# Patient Record
Sex: Male | Born: 1965 | Hispanic: Yes | Marital: Single | Smoking: Never smoker
Health system: Southern US, Community
[De-identification: ages and names within clinical notes are randomized; demographics above are authoritative.]

## PROBLEM LIST (undated history)

## (undated) HISTORY — PX: APPENDECTOMY: SHX54

## (undated) HISTORY — PX: OTHER SURGICAL HISTORY: SHX169

---

## 2018-11-08 ENCOUNTER — Emergency Department (HOSPITAL_COMMUNITY)
Admission: EM | Admit: 2018-11-08 | Discharge: 2018-11-08 | Disposition: A | Discharge status: Home / Self Care | Payer: BLUE CROSS/BLUE SHIELD | Attending: Emergency Medicine | Admitting: Emergency Medicine

## 2018-11-08 ENCOUNTER — Other Ambulatory Visit: Payer: Self-pay

## 2018-11-08 ENCOUNTER — Encounter (HOSPITAL_COMMUNITY): Payer: Self-pay | Admitting: Emergency Medicine

## 2018-11-08 DIAGNOSIS — R110 Nausea: Secondary | ICD-10-CM | POA: Diagnosis not present

## 2018-11-08 DIAGNOSIS — R5383 Other fatigue: Secondary | ICD-10-CM | POA: Diagnosis not present

## 2018-11-08 DIAGNOSIS — R197 Diarrhea, unspecified: Secondary | ICD-10-CM | POA: Insufficient documentation

## 2018-11-08 DIAGNOSIS — R509 Fever, unspecified: Secondary | ICD-10-CM | POA: Diagnosis not present

## 2018-11-08 LAB — CBC WITH DIFFERENTIAL/PLATELET
Abs Immature Granulocytes: 0.07 10*3/uL (ref 0.00–0.07)
Basophils Absolute: 0 10*3/uL (ref 0.0–0.1)
Basophils Relative: 0 %
Eosinophils Absolute: 0 10*3/uL (ref 0.0–0.5)
Eosinophils Relative: 0 %
HCT: 46 % (ref 39.0–52.0)
Hemoglobin: 14.9 g/dL (ref 13.0–17.0)
Immature Granulocytes: 1 %
Lymphocytes Relative: 6 %
Lymphs Abs: 0.9 10*3/uL (ref 0.7–4.0)
MCH: 27.9 pg (ref 26.0–34.0)
MCHC: 32.4 g/dL (ref 30.0–36.0)
MCV: 86 fL (ref 80.0–100.0)
Monocytes Absolute: 0.8 10*3/uL (ref 0.1–1.0)
Monocytes Relative: 6 %
Neutro Abs: 12.7 10*3/uL — ABNORMAL HIGH (ref 1.7–7.7)
Neutrophils Relative %: 87 %
Platelets: 196 10*3/uL (ref 150–400)
RBC: 5.35 MIL/uL (ref 4.22–5.81)
RDW: 12.9 % (ref 11.5–15.5)
WBC: 14.5 10*3/uL — ABNORMAL HIGH (ref 4.0–10.5)
nRBC: 0 % (ref 0.0–0.2)

## 2018-11-08 LAB — COMPREHENSIVE METABOLIC PANEL
ALT: 36 U/L (ref 0–44)
AST: 36 U/L (ref 15–41)
Albumin: 3.6 g/dL (ref 3.5–5.0)
Alkaline Phosphatase: 58 U/L (ref 38–126)
Anion gap: 13 (ref 5–15)
BUN: 18 mg/dL (ref 6–20)
CO2: 21 mmol/L — ABNORMAL LOW (ref 22–32)
Calcium: 8.6 mg/dL — ABNORMAL LOW (ref 8.9–10.3)
Chloride: 101 mmol/L (ref 98–111)
Creatinine, Ser: 1.31 mg/dL — ABNORMAL HIGH (ref 0.61–1.24)
GFR calc Af Amer: >60 mL/min (ref >60)
GFR calc non Af Amer: >60 mL/min (ref >60)
Glucose, Bld: 116 mg/dL — ABNORMAL HIGH (ref 70–99)
Potassium: 3.4 mmol/L — ABNORMAL LOW (ref 3.5–5.1)
Sodium: 135 mmol/L (ref 135–145)
Total Bilirubin: 0.7 mg/dL (ref 0.3–1.2)
Total Protein: 6.6 g/dL (ref 6.5–8.1)

## 2018-11-08 LAB — LIPASE, BLOOD: Lipase: 21 U/L (ref 11–51)

## 2018-11-08 LAB — LACTIC ACID, PLASMA: Lactic Acid, Venous: 1.5 mmol/L (ref 0.5–1.9)

## 2018-11-08 MED ORDER — LOPERAMIDE HCL 2 MG PO CAPS
4.0000 mg | ORAL_CAPSULE | Freq: Once | ORAL | Status: AC
  Administered 2018-11-08: 4 mg via ORAL
  Filled 2018-11-08: qty 2

## 2018-11-08 MED ORDER — ONDANSETRON HCL 4 MG PO TABS: 4.0000 mg | ORAL_TABLET | Freq: Four times a day (QID) | ORAL | 0 refills | Status: DC

## 2018-11-08 MED ORDER — LOPERAMIDE HCL 2 MG PO CAPS: 2.0000 mg | ORAL_CAPSULE | Freq: Four times a day (QID) | ORAL | 0 refills | Status: DC | PRN

## 2018-11-08 MED ORDER — SODIUM CHLORIDE 0.9 % IV BOLUS
1000.0000 mL | Freq: Once | INTRAVENOUS | Status: AC
  Administered 2018-11-08: 1000 mL via INTRAVENOUS

## 2018-11-08 MED ORDER — ONDANSETRON HCL 4 MG/2ML IJ SOLN
4.0000 mg | Freq: Once | INTRAMUSCULAR | Status: AC
  Administered 2018-11-08: 4 mg via INTRAVENOUS
  Filled 2018-11-08: qty 2

## 2018-11-08 MED ORDER — ACETAMINOPHEN 325 MG PO TABS
650.0000 mg | ORAL_TABLET | Freq: Once | ORAL | Status: AC
  Administered 2018-11-08: 650 mg via ORAL
  Filled 2018-11-08: qty 2

## 2018-11-08 MED ORDER — SODIUM CHLORIDE 0.9 % IV BOLUS
500.0000 mL | Freq: Once | INTRAVENOUS | Status: AC
  Administered 2018-11-08: 500 mL via INTRAVENOUS

## 2018-11-08 MED ORDER — IBUPROFEN 800 MG PO TABS
800.0000 mg | ORAL_TABLET | Freq: Once | ORAL | Status: AC
  Administered 2018-11-08: 800 mg via ORAL
  Filled 2018-11-08: qty 1

## 2018-11-08 NOTE — ED Provider Notes (Signed)
[facility] EMERGENCY DEPARTMENT Provider Note   CSN: [id number] Arrival date & time: 11/08/18  1696    History   Chief Complaint Chief Complaint  Patient presents with  . Fever  . Nausea  . Emesis  . Diarrhea    HPI Allen Gross is a 53 y.o. male.     HPI   53 year old male presents today with complaints of diarrhea and fever.  Patient notes yesterday around 4 PM he developed diarrhea and fatigue.  He notes fever at home, he notes taking Tylenol yesterday.  He reports no frank blood with his diarrhea but notes that it is extremely watery and slightly red.  He denies any associate abdominal pain, he notes some nausea denies any vomiting.  He denies any abnormal exposures but notes that he did recently come back from [place] last week.  Reports while there he drank only bottled water.  He notes 7 bowel movements over the last day.  He denies any insect bites, denies any close sick contacts. No recent antibiotics.    History reviewed. No pertinent past medical history.  There are no active problems to display for this patient.   Past Surgical History:  Procedure Laterality Date  . APPENDECTOMY    . toxoplasmosis         Home Medications    Prior to Admission medications   Medication Sig Start Date End Date Taking? Authorizing Provider  loperamide (IMODIUM) 2 MG capsule Take 1 capsule (2 mg total) by mouth 4 (four) times daily as needed for diarrhea or loose stools. 11/08/18   Mable Lashley, Tinnie Gens, PA-C  ondansetron (ZOFRAN) 4 MG tablet Take 1 tablet (4 mg total) by mouth every 6 (six) hours. 11/08/18   Eyvonne Mechanic, PA-C    Family History History reviewed. No pertinent family history.  Social History Social History   Tobacco Use  . Smoking status: Never Smoker  . Smokeless tobacco: Never Used  Substance Use Topics  . Alcohol use: Never    Frequency: Never  . Drug use: Never     Allergies   Patient has no known allergies.   Review  of Systems Review of Systems  All other systems reviewed and are negative.    Physical Exam Updated Vital Signs BP 103/71   Pulse 92   Temp 98.5 F (36.9 C) (Rectal)   Resp 16   Ht 5\' 8"  (1.727 m)   Wt 68 kg   SpO2 99%   BMI 22.81 kg/m   Physical Exam Vitals signs and nursing note reviewed.  Constitutional:      Appearance: He is well-developed.  HENT:     Head: Normocephalic and atraumatic.     Comments: Oropharynx clear no erythema Eyes:     General: No scleral icterus.       Right eye: No discharge.        Left eye: No discharge.     Conjunctiva/sclera: Conjunctivae normal.     Pupils: Pupils are equal, round, and reactive to light.  Neck:     Musculoskeletal: Normal range of motion.     Vascular: No JVD.     Trachea: No tracheal deviation.  Pulmonary:     Effort: Pulmonary effort is normal.     Breath sounds: No stridor.  Abdominal:     General: There is no distension.     Palpations: Abdomen is soft. There is no mass.     Tenderness: There is no abdominal tenderness. There is  no right CVA tenderness, guarding or rebound.     Hernia: No hernia is present.  Skin:    Comments: No rashes  Neurological:     Mental Status: He is alert and oriented to person, place, and time.     Coordination: Coordination normal.  Psychiatric:        Behavior: Behavior normal.        Thought Content: Thought content normal.        Judgment: Judgment normal.     ED Treatments / Results  Labs (all labs ordered are listed, but only abnormal results are displayed) Labs Reviewed  CBC WITH DIFFERENTIAL/PLATELET - Abnormal; Notable for the following components:      Result Value   WBC 14.5 (*)    Neutro Abs 12.7 (*)    All other components within normal limits  COMPREHENSIVE METABOLIC PANEL - Abnormal; Notable for the following components:   Potassium 3.4 (*)    CO2 21 (*)    Glucose, Bld 116 (*)    Creatinine, Ser 1.31 (*)    Calcium 8.6 (*)    All other components  within normal limits  GASTROINTESTINAL PANEL BY PCR, STOOL (REPLACES STOOL CULTURE)  LIPASE, BLOOD  LACTIC ACID, PLASMA  LACTIC ACID, PLASMA    EKG None  Radiology No results found.  Procedures Procedures (including critical care time)  Medications Ordered in ED Medications  sodium chloride 0.9 % bolus 1,000 mL (0 mLs Intravenous Stopped 11/08/18 1124)  acetaminophen (TYLENOL) tablet 650 mg (650 mg Oral Given 11/08/18 0947)  ondansetron (ZOFRAN) injection 4 mg (4 mg Intravenous Given 11/08/18 0948)  sodium chloride 0.9 % bolus 1,000 mL (0 mLs Intravenous Stopped 11/08/18 1416)  ibuprofen (ADVIL,MOTRIN) tablet 800 mg (800 mg Oral Given 11/08/18 1123)  sodium chloride 0.9 % bolus 500 mL (0 mLs Intravenous Stopped 11/08/18 1534)  loperamide (IMODIUM) capsule 4 mg (4 mg Oral Given 11/08/18 1521)     Initial Impression / Assessment and Plan / ED Course  I have reviewed the triage vital signs and the nursing notes.  Pertinent labs & imaging results that were available during my care of the patient were reviewed by me and considered in my medical decision making (see chart for details).        Labs: GI panel, lactic acid, CBC, CMP, lipase  Imaging:  Consults:  Therapeutics: Imodium, normal saline  Discharge Meds: Cipro, Imodium  Assessment/Plan: 53 year old male presents today with diarrhea.  Does have watery brown diarrhea.  He has no associated abdominal pain, but he does have a fever.  He did appear to be dehydrated on my initial exam.  He was given several liters of fluid which dramatically improved his symptoms.  He is tolerating p.o. here.  Given his fever and numerous episodes of diarrhea stool studies were sent.  I do find it reasonable to treat this patient empirically with Cipro.  Patient does have recent travel, lower suspicion for cholera, but Cipro should be sufficient coverage in the event it is.  Patient is tolerating p.o. no acute distress.  I do feel he is stable for  outpatient management.  He will return immediately if he develops any new or worsening signs or symptoms.  He remains febrile continue diarrhea return in 36 hours, if diarrhea persists he will follow-up as an outpatient.  He is given strict return precautions, he verbalized understanding and agreement to today's plan.   Final Clinical Impressions(s) / ED Diagnoses   Final diagnoses:  Diarrhea,  unspecified type    ED Discharge Orders         Ordered    loperamide (IMODIUM) 2 MG capsule  4 times daily PRN     11/08/18 1503    ondansetron (ZOFRAN) 4 MG tablet  Every 6 hours     11/08/18 1503           Eyvonne Mechanic, PA-C 11/08/18 1551    Azalia Bilis, MD 11/08/18 1726

## 2018-11-08 NOTE — ED Notes (Signed)
Patient verbalizes understanding of discharge instructions. Opportunity for questioning and answers were provided. Pt discharged from ED. 

## 2018-11-08 NOTE — ED Triage Notes (Signed)
Pt reports fever, n/v since yesterday. Reports recent travel from [place]

## 2018-11-08 NOTE — ED Triage Notes (Signed)
States that he took tylenol and immodium yesterday

## 2018-11-08 NOTE — Discharge Instructions (Addendum)
Please read the attached information.  Please use medications as directed.  If you have worsening of symptoms or your fever persists beyond 36 hours please return to the emergency room for repeat evaluation.  If your symptoms of diarrhea continue to persist please follow-up with primary care infectious disease.  Please return immediately for any new or worsening signs or symptoms.

## 2018-11-09 LAB — GASTROINTESTINAL PANEL BY PCR, STOOL (REPLACES STOOL CULTURE)
Adenovirus F40/41: NOT DETECTED
Astrovirus: NOT DETECTED
Campylobacter species: NOT DETECTED
Cryptosporidium: NOT DETECTED
Cyclospora cayetanensis: NOT DETECTED
Entamoeba histolytica: NOT DETECTED
Enteroaggregative E coli (EAEC): DETECTED — AB
Enteropathogenic E coli (EPEC): DETECTED — AB
Enterotoxigenic E coli (ETEC): NOT DETECTED
Giardia lamblia: NOT DETECTED
Norovirus GI/GII: NOT DETECTED
Plesimonas shigelloides: NOT DETECTED
Rotavirus A: NOT DETECTED
Salmonella species: NOT DETECTED
Sapovirus (I, II, IV, and V): NOT DETECTED
Shiga like toxin producing E coli (STEC): NOT DETECTED
Shigella/Enteroinvasive E coli (EIEC): DETECTED — AB
Vibrio cholerae: NOT DETECTED
Vibrio species: NOT DETECTED
Yersinia enterocolitica: NOT DETECTED

## 2020-06-23 ENCOUNTER — Encounter (HOSPITAL_COMMUNITY): Payer: Self-pay | Admitting: Urgent Care

## 2020-06-23 ENCOUNTER — Ambulatory Visit (HOSPITAL_COMMUNITY)
Admission: EM | Admit: 2020-06-23 | Discharge: 2020-06-23 | Disposition: A | Discharge status: Home / Self Care | Payer: 59 | Attending: Urgent Care | Admitting: Urgent Care

## 2020-06-23 ENCOUNTER — Other Ambulatory Visit: Payer: Self-pay

## 2020-06-23 ENCOUNTER — Ambulatory Visit (INDEPENDENT_AMBULATORY_CARE_PROVIDER_SITE_OTHER): Payer: 59

## 2020-06-23 DIAGNOSIS — R319 Hematuria, unspecified: Secondary | ICD-10-CM | POA: Diagnosis present

## 2020-06-23 DIAGNOSIS — R0789 Other chest pain: Secondary | ICD-10-CM | POA: Insufficient documentation

## 2020-06-23 DIAGNOSIS — R9431 Abnormal electrocardiogram [ECG] [EKG]: Secondary | ICD-10-CM | POA: Diagnosis present

## 2020-06-23 DIAGNOSIS — I4510 Unspecified right bundle-branch block: Secondary | ICD-10-CM | POA: Diagnosis present

## 2020-06-23 DIAGNOSIS — R220 Localized swelling, mass and lump, head: Secondary | ICD-10-CM | POA: Insufficient documentation

## 2020-06-23 DIAGNOSIS — R202 Paresthesia of skin: Secondary | ICD-10-CM | POA: Insufficient documentation

## 2020-06-23 DIAGNOSIS — M546 Pain in thoracic spine: Secondary | ICD-10-CM

## 2020-06-23 DIAGNOSIS — R200 Anesthesia of skin: Secondary | ICD-10-CM | POA: Diagnosis not present

## 2020-06-23 DIAGNOSIS — R079 Chest pain, unspecified: Secondary | ICD-10-CM | POA: Diagnosis not present

## 2020-06-23 DIAGNOSIS — R509 Fever, unspecified: Secondary | ICD-10-CM

## 2020-06-23 LAB — POCT URINALYSIS DIPSTICK, ED / UC
Bilirubin Urine: NEGATIVE
Glucose, UA: NEGATIVE mg/dL
Ketones, ur: NEGATIVE mg/dL
Leukocytes,Ua: NEGATIVE
Nitrite: NEGATIVE
Protein, ur: NEGATIVE mg/dL
Specific Gravity, Urine: 1.02 (ref 1.005–1.030)
Urobilinogen, UA: 0.2 mg/dL (ref 0.0–1.0)
pH: 6 (ref 5.0–8.0)

## 2020-06-23 MED ORDER — PREDNISONE 10 MG PO TABS: 30.0000 mg | ORAL_TABLET | Freq: Every day | ORAL | 0 refills | Status: DC

## 2020-06-23 NOTE — ED Provider Notes (Signed)
[facility]   MRN: [id number] DOB: 04-09-1966  Subjective:   Allen Gross is a 54 y.o. male presenting for 3-day history of acute onset mid/thoracic back pain bilaterally progressing into bilateral lower lateral chest pain, chest tightness today.  Also felt tingling of his right hand, upper lip with swelling.  Patient states that he actually took 1 dose of ciprofloxacin left over from a prescription that he obtained in May, did this yesterday. Was diagnosed with "kidney infection" in [place]. Has otherwise not started any other medications recently.  Denies fever, confusion, sore throat, cough, shortness of breath, belly pain.  He does admit that his chest pain does radiate inferiorly over his flank sides.  Denies any urinary symptoms.  Denies drug use, smoking.  No new foods or exposures that he can think of.  He has been Covid vaccinated, last dose was in April 2021.  Denies taking chronic medications.    No Known Allergies  History reviewed. No pertinent past medical history.   Past Surgical History:  Procedure Laterality Date  . APPENDECTOMY    . toxoplasmosis      History reviewed. No pertinent family history.  Social History   Tobacco Use  . Smoking status: Never Smoker  . Smokeless tobacco: Never Used  Substance Use Topics  . Alcohol use: Never  . Drug use: Never    ROS   Objective:   Vitals: BP (!) 145/91 (BP Location: Right Arm)   Pulse 95   Temp 98.9 F (37.2 C) (Oral)   Resp 18   SpO2 99%   Physical Exam Constitutional:      General: He is not in acute distress.    Appearance: Normal appearance. He is well-developed. He is not ill-appearing, toxic-appearing or diaphoretic.  HENT:     Head: Normocephalic and atraumatic.      Right Ear: External ear normal.     Left Ear: External ear normal.     Nose: Nose normal.     Mouth/Throat:     Mouth: Mucous membranes are moist.     Pharynx: Oropharynx is clear. No oropharyngeal  exudate or posterior oropharyngeal erythema.  Eyes:     General: No scleral icterus.       Right eye: No discharge.        Left eye: No discharge.     Extraocular Movements: Extraocular movements intact.     Conjunctiva/sclera: Conjunctivae normal.     Pupils: Pupils are equal, round, and reactive to light.  Cardiovascular:     Rate and Rhythm: Normal rate and regular rhythm.     Heart sounds: Normal heart sounds. No murmur heard.  No friction rub. No gallop.   Pulmonary:     Effort: Pulmonary effort is normal. No respiratory distress.     Breath sounds: Normal breath sounds. No stridor. No wheezing, rhonchi or rales.  Chest:     Chest wall: No tenderness.  Abdominal:     General: Bowel sounds are normal. There is no distension.     Palpations: Abdomen is soft. There is no mass.     Tenderness: There is no abdominal tenderness. There is no right CVA tenderness, left CVA tenderness, guarding or rebound.  Skin:    General: Skin is warm and dry.  Neurological:     Mental Status: He is alert and oriented to person, place, and time.     Cranial Nerves: No cranial nerve deficit.     Motor: No weakness.  Coordination: Coordination normal.     Gait: Gait normal.     Deep Tendon Reflexes: Reflexes normal.  Psychiatric:        Mood and Affect: Mood normal.        Behavior: Behavior normal.        Thought Content: Thought content normal.        Judgment: Judgment normal.     ED ECG REPORT   Date: 06/23/2020  Rate: 83bpm  Rhythm: normal sinus rhythm  QRS Axis: normal  Intervals: normal  ST/T Wave abnormalities: nonspecific T wave changes  Conduction Disutrbances:none  Narrative Interpretation: Sinus rhythm at 83 bpm with right bundle branch block, T wave inversion in leads V1 through V3.  Also has T wave flattening in lead III.  No previous EKG for comparison.  Old EKG Reviewed: none available  I have personally reviewed the EKG tracing and agree with the computerized  printout as noted.  DG Chest 2 View  Result Date: 06/23/2020 CLINICAL DATA:  Chest pain, thoracic back pain. EXAM: CHEST - 2 VIEW COMPARISON:  None. FINDINGS: The heart size and mediastinal contours are within normal limits. Streaky bibasilar opacities, likely atelectasis. No pneumothorax or pleural effusion. No acute osseous abnormality. IMPRESSION: Streaky basilar opacities, likely atelectasis. Otherwise no focal airspace disease. Electronically Signed   By: Stana Bunting M.D.   On: 06/23/2020 12:17   Results for orders placed or performed during the hospital encounter of 06/23/20 (from the past 24 hour(s))  POC Urinalysis dipstick     Status: Abnormal   Collection Time: 06/23/20 12:39 PM  Result Value Ref Range   Glucose, UA NEGATIVE NEGATIVE mg/dL   Bilirubin Urine NEGATIVE NEGATIVE   Ketones, ur NEGATIVE NEGATIVE mg/dL   Specific Gravity, Urine 1.020 1.005 - 1.030   Hgb urine dipstick SMALL (A) NEGATIVE   pH 6.0 5.0 - 8.0   Protein, ur NEGATIVE NEGATIVE mg/dL   Urobilinogen, UA 0.2 0.0 - 1.0 mg/dL   Nitrite NEGATIVE NEGATIVE   Leukocytes,Ua NEGATIVE NEGATIVE    Assessment and Plan :   PDMP not reviewed this encounter.  1. Atypical chest pain   2. Acute bilateral thoracic back pain   3. Lip swelling   4. Tingling   5. Right bundle branch block   6. Nonspecific abnormal electrocardiogram (ECG) (EKG)   7. Hematuria, unspecified type     Case discussed with Dr. Delton See.  Suspect patient has renal colic.  Recommended hydrating aggressively, Tylenol for pain control.  Suspect his use of ciprofloxacin resulted in an allergic reaction and therefore will use a prednisone course.  Incidental finding of right bundle branch block, recommended consultation with a cardiologist, follow-up with his PCP to set up a referral.  Patient preferred to hold off on using more medications for pain control given her suspicion of an allergic reaction.  Recommended prednisone for this. Counseled  patient on potential for adverse effects with medications prescribed/recommended today, ER and return-to-clinic precautions discussed, patient verbalized understanding.    Wallis Bamberg, PA-C 06/23/20 1255

## 2020-06-23 NOTE — ED Triage Notes (Addendum)
Pt reports fever and chills  x 2 days; chest tightness, tingling right arm and back pain x 1 day; upper lip swelling and numb for the past 2 hrs. Denies vision changes, nausea, diarrhea.   Pt reports he took azithromycin x 2 days, last dose yesterday.

## 2020-06-24 LAB — URINE CULTURE: Culture: NO GROWTH

## 2020-06-26 DIAGNOSIS — U071 COVID-19: Secondary | ICD-10-CM

## 2020-06-26 HISTORY — DX: COVID-19: U07.1

## 2020-07-17 ENCOUNTER — Other Ambulatory Visit: Payer: Self-pay

## 2020-07-18 ENCOUNTER — Other Ambulatory Visit: Payer: Self-pay

## 2020-07-18 ENCOUNTER — Encounter: Payer: Self-pay | Admitting: Cardiology

## 2020-07-18 ENCOUNTER — Ambulatory Visit (INDEPENDENT_AMBULATORY_CARE_PROVIDER_SITE_OTHER): Payer: 59 | Admitting: Cardiology

## 2020-07-18 DIAGNOSIS — R0789 Other chest pain: Secondary | ICD-10-CM

## 2020-07-18 DIAGNOSIS — R0600 Dyspnea, unspecified: Secondary | ICD-10-CM

## 2020-07-18 DIAGNOSIS — I4510 Unspecified right bundle-branch block: Secondary | ICD-10-CM

## 2020-07-18 DIAGNOSIS — Z8616 Personal history of COVID-19: Secondary | ICD-10-CM

## 2020-07-18 DIAGNOSIS — R0609 Other forms of dyspnea: Secondary | ICD-10-CM

## 2020-07-18 DIAGNOSIS — R9431 Abnormal electrocardiogram [ECG] [EKG]: Secondary | ICD-10-CM

## 2020-07-18 HISTORY — DX: Other chest pain: R07.89

## 2020-07-18 HISTORY — DX: Unspecified right bundle-branch block: I45.10

## 2020-07-18 HISTORY — DX: Dyspnea, unspecified: R06.00

## 2020-07-18 HISTORY — DX: Personal history of COVID-19: Z86.16

## 2020-07-18 HISTORY — DX: Other forms of dyspnea: R06.09

## 2020-07-18 HISTORY — DX: Abnormal electrocardiogram (ECG) (EKG): R94.31

## 2020-07-18 NOTE — Patient Instructions (Addendum)
Medication Instructions:  Your physician recommends that you continue on your current medications as directed. Please refer to the Current Medication list given to you today.  *If you need a refill on your cardiac medications before your next appointment, please call your pharmacy*   Lab Work: NONE If you have labs (blood work) drawn today and your tests are completely normal, you will receive your results only by: Marland Kitchen MyChart Message (if you have MyChart) OR . A paper copy in the mail If you have any lab test that is abnormal or we need to change your treatment, we will call you to review the results.   Testing/Procedures: Your physician has requested that you have an echocardiogram. Echocardiography is a painless test that uses sound waves to create images of your heart. It provides your doctor with information about the size and shape of your heart and how well your heart's chambers and valves are working. This procedure takes approximately one hour. There are no restrictions for this procedure.     Follow-Up: At [facility], you and your health needs are our priority.  As part of our continuing mission to provide you with exceptional heart care, we have created designated Provider Care Teams.  These Care Teams include your primary Cardiologist (physician) and Advanced Practice Providers (APPs -  Physician Assistants and Nurse Practitioners) who all work together to provide you with the care you need, when you need it.  We recommend signing up for the patient portal called "MyChart".  Sign up information is provided on this After Visit Summary.  MyChart is used to connect with patients for Virtual Visits (Telemedicine).  Patients are able to view lab/test results, encounter notes, upcoming appointments, etc.  Non-urgent messages can be sent to your provider as well.   To learn more about what you can do with MyChart, go to [email or web address].    Your next appointment:   2  month(s)  The format for your next appointment:   In Person  Provider:   Gypsy Balsam, MD   Other Instructions

## 2020-07-18 NOTE — Progress Notes (Signed)
Cardiology Consultation:    Date:  07/18/2020   ID:  Demetria Lightsey, DOB 12-Jun-1966, MRN [id number]  PCP:  Shelle Iron, MD  Cardiologist:  Gypsy Balsam, MD   Referring MD: Shelle Iron, MD   No chief complaint on file. Chest pain  History of Present Illness:    Allen Gross is a 54 y.o. male who is being seen today for the evaluation of chest pain at the request of Sistasis, Rosanne Ashing, MD.  He is originally from [place], few weeks ago he ended going to the hospital because of pressure in the chest that he felt.  He was treated for possible renal colic, he was also given some steroids however eventually he was discovered to have COVID-19 infection.  He said he was sick for about 1 to 2 days felt pressure in the chest.  Since that time he still gets some shortness of breath but overall recovering quite nicely.  Described to have some electrical kind of sensation in the chest lasting for few minutes not related to exercise.  That happened twice within the last month or so.  Also when he was in the emergency room because of his chest pain EKG was done which showed right bundle branch block.  That is the reason why he is coming to our office.  He denies have any palpitation dizziness there is no swelling of lower extremities he has no difficulty sleeping overall he is trying to be active exercise on the regular basis still in the process of recovering from COVID-19 infection.  History reviewed. No pertinent past medical history.  Past Surgical History:  Procedure Laterality Date  . APPENDECTOMY    . toxoplasmosis      Current Medications: Current Meds  Medication Sig  . acetaminophen (TYLENOL) 325 MG tablet Take by mouth every 6 (six) hours as needed.     Allergies:   Ciprofloxacin   Social History   Socioeconomic History  . Marital status: Single    Spouse name: Not on file  . Number of children: Not on file  . Years of education: Not on file  . Highest education level:  Not on file  Occupational History  . Not on file  Tobacco Use  . Smoking status: Never Smoker  . Smokeless tobacco: Never Used  Substance and Sexual Activity  . Alcohol use: Never  . Drug use: Never  . Sexual activity: Not on file  Other Topics Concern  . Not on file  Social History Narrative  . Not on file   Social Determinants of Health   Financial Resource Strain:   . Difficulty of Paying Living Expenses: Not on file  Food Insecurity:   . Worried About Programme researcher, broadcasting/film/video in the Last Year: Not on file  . Ran Out of Food in the Last Year: Not on file  Transportation Needs:   . Lack of Transportation (Medical): Not on file  . Lack of Transportation (Non-Medical): Not on file  Physical Activity:   . Days of Exercise per Week: Not on file  . Minutes of Exercise per Session: Not on file  Stress:   . Feeling of Stress : Not on file  Social Connections:   . Frequency of Communication with Friends and Family: Not on file  . Frequency of Social Gatherings with Friends and Family: Not on file  . Attends Religious Services: Not on file  . Active Member of Clubs or Organizations: Not on file  . Attends Banker  Meetings: Not on file  . Marital Status: Not on file     Family History: The patient's family history includes Diabetes in his father and mother; Heart attack in his mother; Hypertension in his father and mother; Stroke in his maternal grandfather. ROS:   Please see the history of present illness.    All 14 point review of systems negative except as described per history of present illness.  EKGs/Labs/Other Studies Reviewed:    The following studies were reviewed today: EKG reviewed from the hospital which showed normal sinus rhythm rate 87, right bundle branch block.  EKG:  EKG is  ordered today.  The ekg ordered today demonstrates normal sinus rhythm normal P interval rate 69, normal QS complex duration morphology no ST segment changes  Recent Labs: No  results found for requested labs within last 8760 hours.  Recent Lipid Panel No results found for: CHOL, TRIG, HDL, CHOLHDL, VLDL, LDLCALC, LDLDIRECT  Physical Exam:    VS:  BP 130/80 (BP Location: Left Arm, Patient Position: Sitting, Cuff Size: Normal)   Pulse 69   Ht 5\' 8"  (1.727 m)   Wt 145 lb (65.8 kg)   SpO2 97%   BMI 22.05 kg/m     Wt Readings from Last 3 Encounters:  07/18/20 145 lb (65.8 kg)  11/08/18 150 lb (68 kg)     GEN:  Well nourished, well developed in no acute distress HEENT: Normal NECK: No JVD; No carotid bruits LYMPHATICS: No lymphadenopathy CARDIAC: RRR, no murmurs, no rubs, no gallops RESPIRATORY:  Clear to auscultation without rales, wheezing or rhonchi  ABDOMEN: Soft, non-tender, non-distended MUSCULOSKELETAL:  No edema; No deformity  SKIN: Warm and dry NEUROLOGIC:  Alert and oriented x 3 PSYCHIATRIC:  Normal affect   ASSESSMENT:    1. Nonspecific abnormal electrocardiogram (ECG) (EKG)   2. Right bundle branch block   3. Dyspnea on exertion   4. History of COVID-19   5. Atypical chest pain    PLAN:    In order of problems listed above:  1. Right bundle branch block which appears to be rate related.  Today his EKG showed normal QS complex.  It is possible that he did have some pulmonary issue ongoing with Covid and that is what make his EKG being abnormal.  Plan will be to schedule him to have echocardiogram done to assess left ventricle ejection fraction more importantly look at the right ventricle size and function. 2. Dyspnea on exertion probably recovered from Covid, he did have some atelectasis on chest x-ray while in the emergency room, will get echocardiogram to look at the left ventricle ejection fraction. 3. History of COVID-19 recovering. 4. Atypical chest pain identically to pursue any ischemia work-up at this stage.  He needs some more time to recover after COVID-19.  And will talk to him again.  See him back in my office in about 6  weeks to 2 months. 5. Dyslipidemia: 6. When I see him I will recheck his fasting lipid profile.  He said that he did have some high cholesterol was given medication however he finished medication did not continue.  Again we will recheck his fasting lipid profile when he will be back here next time in about 2 months.   Medication Adjustments/Labs and Tests Ordered: Current medicines are reviewed at length with the patient today.  Concerns regarding medicines are outlined above.  No orders of the defined types were placed in this encounter.  No orders of the defined types  were placed in this encounter.   Signed, Georgeanna Lea, MD, [facility]. 07/18/2020 11:14 AM    [facility]

## 2020-08-13 ENCOUNTER — Other Ambulatory Visit: Payer: 59

## 2020-08-13 ENCOUNTER — Ambulatory Visit (INDEPENDENT_AMBULATORY_CARE_PROVIDER_SITE_OTHER): Payer: 59

## 2020-08-13 ENCOUNTER — Other Ambulatory Visit: Payer: Self-pay

## 2020-08-13 DIAGNOSIS — R0609 Other forms of dyspnea: Secondary | ICD-10-CM

## 2020-08-13 DIAGNOSIS — R0600 Dyspnea, unspecified: Secondary | ICD-10-CM | POA: Diagnosis not present

## 2020-08-13 DIAGNOSIS — R0789 Other chest pain: Secondary | ICD-10-CM

## 2020-08-13 DIAGNOSIS — I4510 Unspecified right bundle-branch block: Secondary | ICD-10-CM

## 2020-08-13 LAB — ECHOCARDIOGRAM COMPLETE
Area-P 1/2: 3.85 cm2
S' Lateral: 2.7 cm

## 2020-08-13 NOTE — Progress Notes (Signed)
Complete echocardiogram performed.  Jimmy Jcion Buddenhagen RDCS, RVT  

## 2020-09-10 ENCOUNTER — Other Ambulatory Visit: Payer: 59

## 2020-09-19 ENCOUNTER — Other Ambulatory Visit: Payer: Self-pay

## 2020-09-23 ENCOUNTER — Ambulatory Visit: Payer: 59 | Admitting: Cardiology

## 2020-11-28 ENCOUNTER — Other Ambulatory Visit: Payer: Self-pay

## 2020-11-28 ENCOUNTER — Ambulatory Visit (INDEPENDENT_AMBULATORY_CARE_PROVIDER_SITE_OTHER): Payer: 59 | Admitting: Cardiology

## 2020-11-28 ENCOUNTER — Encounter: Payer: Self-pay | Admitting: Cardiology

## 2020-11-28 VITALS — BP 112/82 | HR 83 | Ht 68.0 in | Wt 147.0 lb

## 2020-11-28 DIAGNOSIS — R0789 Other chest pain: Secondary | ICD-10-CM | POA: Diagnosis not present

## 2020-11-28 DIAGNOSIS — R0600 Dyspnea, unspecified: Secondary | ICD-10-CM

## 2020-11-28 DIAGNOSIS — R0609 Other forms of dyspnea: Secondary | ICD-10-CM

## 2020-11-28 DIAGNOSIS — I4510 Unspecified right bundle-branch block: Secondary | ICD-10-CM

## 2020-11-28 DIAGNOSIS — Z8616 Personal history of COVID-19: Secondary | ICD-10-CM | POA: Diagnosis not present

## 2020-11-28 NOTE — Patient Instructions (Signed)

## 2020-11-28 NOTE — Progress Notes (Signed)
Cardiology Office Note:    Date:  11/28/2020   ID:  Allen Gross, DOB 12/10/1965, MRN [id number]  PCP:  Allen Iron, MD  Cardiologist:  Allen Balsam, MD    Referring MD: Allen Iron, MD   Chief Complaint  Patient presents with  . Follow-up  I am doing much better  History of Present Illness:    Allen Gross is a 55 y.o. male who was referred to [place] because of some shortness of breath as well as atypical chest pain after COVID-19 infection.  We did echocardiogram especially in view of the fact that he does have right bundle branch block echocardiogram showed normal chamber size no significant pathology.  He comes today for follow-up.  He is doing much better still have some electrical shocks kind of lasting for short.  Time since then seen in the chest but overall does a getting much better.  There is no shortness of breath.  He walks on the regular basis when he is wife at least once a day he has no difficulty doing it.  He actually feels that he is doing better with exercise.  Past Medical History:  Diagnosis Date  . Atypical chest pain 07/18/2020  . COVID-19 06/26/2020  . Dyspnea on exertion 07/18/2020  . History of COVID-19 07/18/2020  . Nonspecific abnormal electrocardiogram (ECG) (EKG) 07/18/2020  . Right bundle branch block 07/18/2020    Past Surgical History:  Procedure Laterality Date  . APPENDECTOMY    . toxoplasmosis      Current Medications: Current Meds  Medication Sig  . fluticasone (FLONASE) 50 MCG/ACT nasal spray Place 1 spray into both nostrils daily.  . montelukast (SINGULAIR) 10 MG tablet Take 10 mg by mouth at bedtime.     Allergies:   Ciprofloxacin   Social History   Socioeconomic History  . Marital status: Single    Spouse name: Not on file  . Number of children: Not on file  . Years of education: Not on file  . Highest education level: Not on file  Occupational History  . Not on file  Tobacco Use  . Smoking status: Never Smoker   . Smokeless tobacco: Never Used  Substance and Sexual Activity  . Alcohol use: Never  . Drug use: Never  . Sexual activity: Not on file  Other Topics Concern  . Not on file  Social History Narrative  . Not on file   Social Determinants of Health   Financial Resource Strain: Not on file  Food Insecurity: Not on file  Transportation Needs: Not on file  Physical Activity: Not on file  Stress: Not on file  Social Connections: Not on file     Family History: The patient's family history includes Diabetes in his father and mother; Heart attack in his mother; Hypertension in his father and mother; Stroke in his maternal grandfather. ROS:   Please see the history of present illness.    All 14 point review of systems negative except as described per history of present illness  EKGs/Labs/Other Studies Reviewed:      Recent Labs: No results found for requested labs within last 8760 hours.  Recent Lipid Panel No results found for: CHOL, TRIG, HDL, CHOLHDL, VLDL, LDLCALC, LDLDIRECT  Physical Exam:    VS:  BP 112/82 (BP Location: Right Arm, Patient Position: Sitting, Cuff Size: Normal)   Pulse 83   Ht 5\' 8"  (1.727 m)   Wt 147 lb (66.7 kg)   SpO2 97%   BMI 22.35  kg/m     Wt Readings from Last 3 Encounters:  11/28/20 147 lb (66.7 kg)  07/18/20 145 lb (65.8 kg)  11/08/18 150 lb (68 kg)     GEN:  Well nourished, well developed in no acute distress HEENT: Normal NECK: No JVD; No carotid bruits LYMPHATICS: No lymphadenopathy CARDIAC: RRR, no murmurs, no rubs, no gallops RESPIRATORY:  Clear to auscultation without rales, wheezing or rhonchi  ABDOMEN: Soft, non-tender, non-distended MUSCULOSKELETAL:  No edema; No deformity  SKIN: Warm and dry LOWER EXTREMITIES: no swelling NEUROLOGIC:  Alert and oriented x 3 PSYCHIATRIC:  Normal affect   ASSESSMENT:    1. Atypical chest pain   2. Dyspnea on exertion   3. History of COVID-19   4. Right bundle branch block    PLAN:     In order of problems listed above:  1. Atypical chest pain.  I do not think we need to do any ischemia work-up none of his symptoms suggest a ischemic etiology of his symptoms and those are getting better. 2. Dyspnea on exertion after COVID most likely recovering getting better exercise and regular basis with no difficulties. 3. Right bundle branch block.  Chronic probably rate related echocardiogram normal ejection fraction no significant pathology identified. 4. History of COVID-19 recovered from Dr. Betti Cruz. 5. Cholesterol status is unknown.  We will check his fasting lipid profile today   Medication Adjustments/Labs and Tests Ordered: Current medicines are reviewed at length with the patient today.  Concerns regarding medicines are outlined above.  Orders Placed This Encounter  Procedures  . Lipid panel  . EKG 12-Lead   Medication changes: No orders of the defined types were placed in this encounter.   Signed, Georgeanna Lea, MD, [facility] 11/28/2020 4:43 PM    [facility]

## 2020-11-29 LAB — LIPID PANEL
Chol/HDL Ratio: 5.4 ratio — ABNORMAL HIGH (ref 0.0–5.0)
Cholesterol, Total: 228 mg/dL — ABNORMAL HIGH (ref 100–199)
HDL: 42 mg/dL (ref >39)
LDL Chol Calc (NIH): 159 mg/dL — ABNORMAL HIGH (ref 0–99)
Triglycerides: 150 mg/dL — ABNORMAL HIGH (ref 0–149)
VLDL Cholesterol Cal: 27 mg/dL (ref 5–40)

## 2020-12-02 ENCOUNTER — Telehealth: Payer: Self-pay

## 2020-12-02 DIAGNOSIS — E7800 Pure hypercholesterolemia, unspecified: Secondary | ICD-10-CM

## 2020-12-02 MED ORDER — ATORVASTATIN CALCIUM 10 MG PO TABS: 10.0000 mg | ORAL_TABLET | Freq: Every day | ORAL | 3 refills | Status: DC

## 2020-12-02 NOTE — Telephone Encounter (Signed)
-----   Message from Georgeanna Lea, MD sent at 12/02/2020  8:32 AM EDT ----- Cholesterol very elevated, I would suggest to start cholesterol-lowering medication.  If he is willing Lipitor 10 mg daily should be started.  Fasting lipid profile, AST LT should be checked in 6 weeks

## 2020-12-02 NOTE — Telephone Encounter (Signed)
Patient notified and verbalized understanding and agrees with Dr. Bing Matter recommendation. Rx sent and lab order completed. Patient aware to be fasting for labs

## 2022-03-10 ENCOUNTER — Encounter: Payer: Self-pay | Admitting: Family Medicine

## 2022-03-13 IMAGING — DX DG CHEST 2V
2 series · 2 of 2 positions shown · non-contrast
Comparison: None.

CLINICAL DATA: Chest pain, thoracic back pain.

EXAM:
CHEST - 2 VIEW

[chest pa]
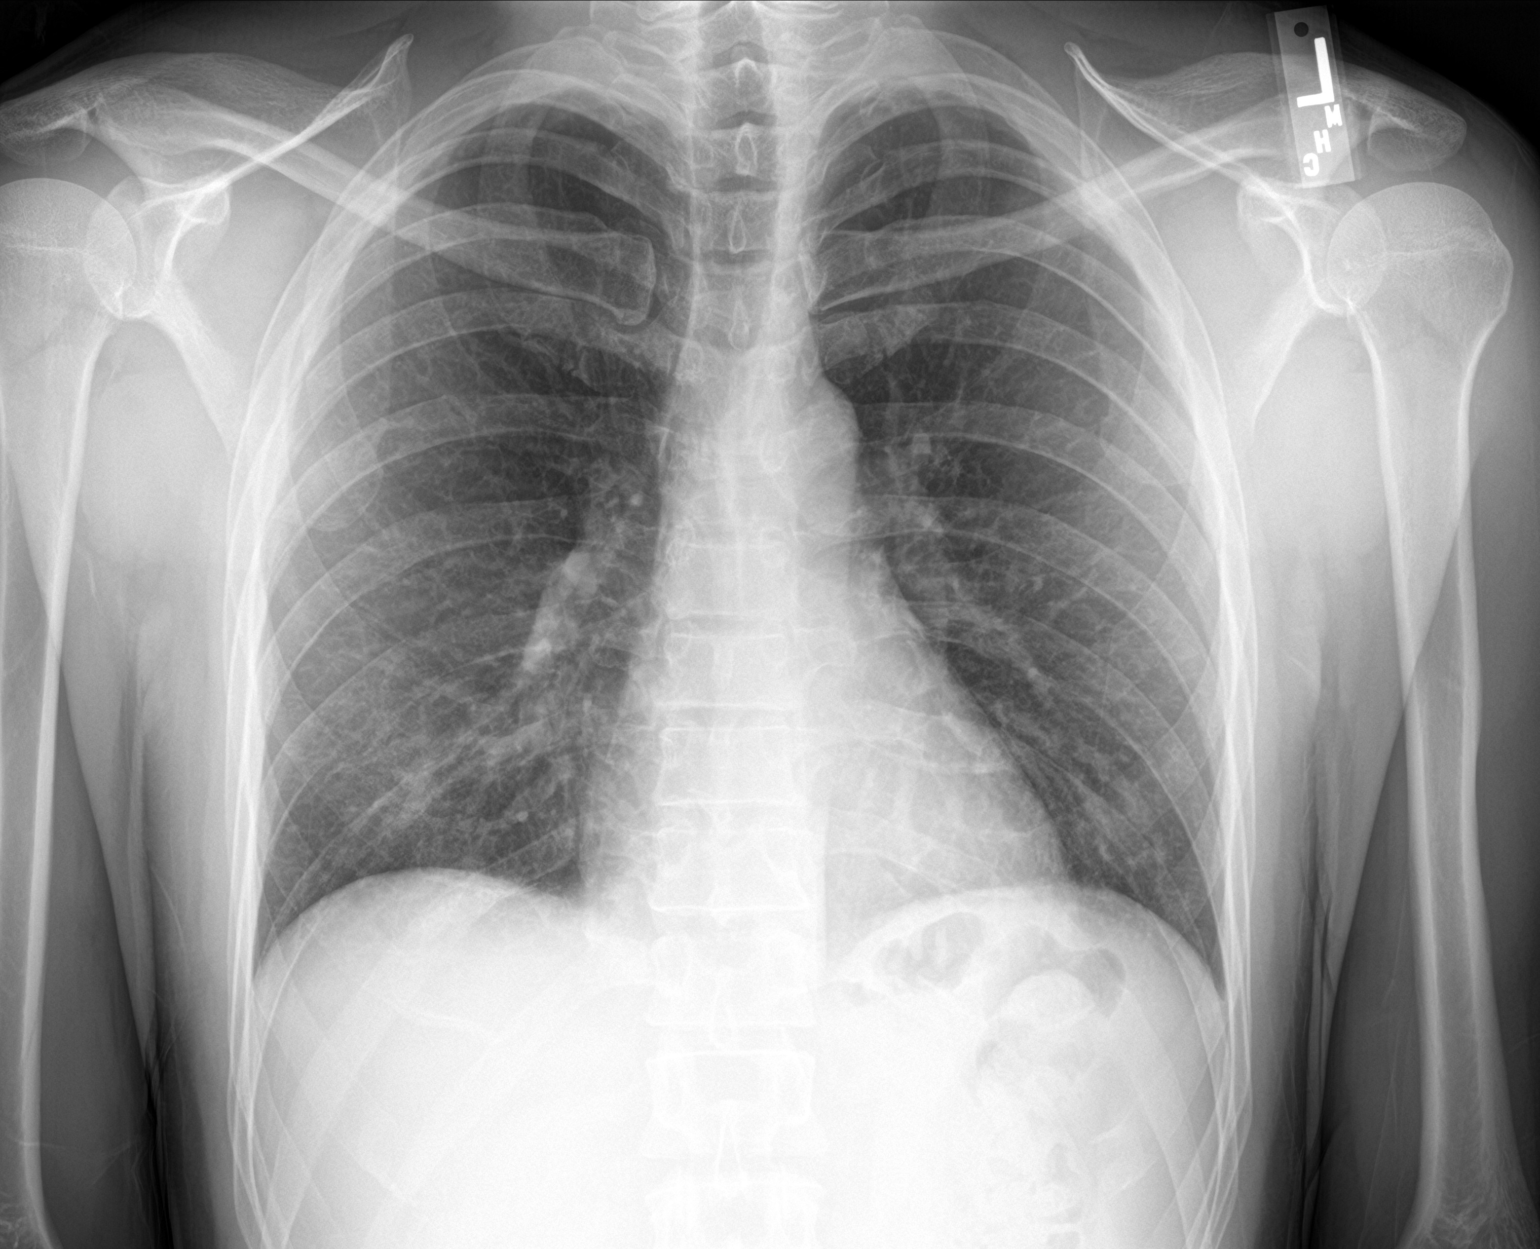

[chest lat]
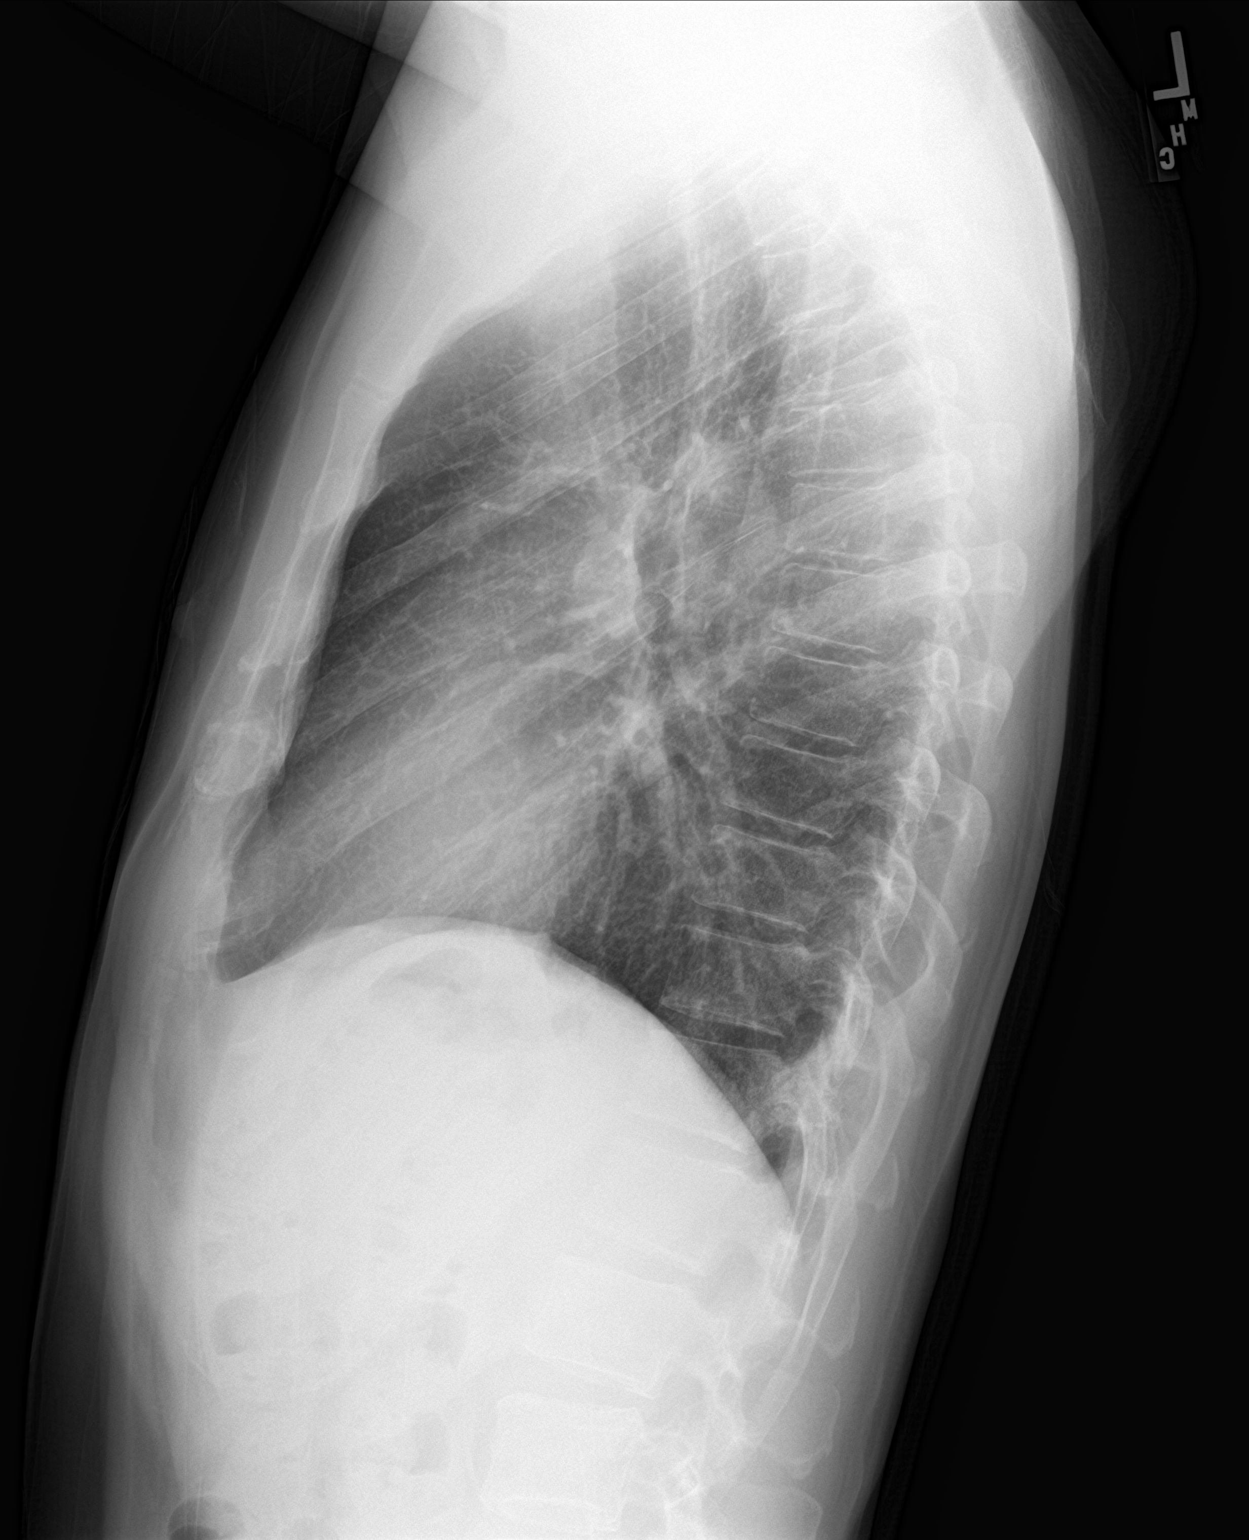

[2 of 2 positions shown; findings below may reference images not displayed]

FINDINGS: The heart size and mediastinal contours are within normal limits.
Streaky bibasilar opacities, likely atelectasis. No pneumothorax or
pleural effusion. No acute osseous abnormality.
IMPRESSION: Streaky basilar opacities, likely atelectasis.

Otherwise no focal airspace disease.

## 2022-03-26 ENCOUNTER — Encounter: Payer: Self-pay | Admitting: Family Medicine

## 2022-08-13 ENCOUNTER — Encounter (HOSPITAL_COMMUNITY): Payer: Self-pay | Admitting: *Deleted

## 2022-08-13 ENCOUNTER — Ambulatory Visit (HOSPITAL_COMMUNITY)
Admission: EM | Admit: 2022-08-13 | Discharge: 2022-08-13 | Disposition: A | Discharge status: Home / Self Care | Payer: Commercial Managed Care - HMO | Attending: Nurse Practitioner | Admitting: Nurse Practitioner

## 2022-08-13 DIAGNOSIS — Z23 Encounter for immunization: Secondary | ICD-10-CM | POA: Diagnosis not present

## 2022-08-13 DIAGNOSIS — S61210A Laceration without foreign body of right index finger without damage to nail, initial encounter: Secondary | ICD-10-CM | POA: Diagnosis not present

## 2022-08-13 MED ORDER — TETANUS-DIPHTH-ACELL PERTUSSIS 5-2.5-18.5 LF-MCG/0.5 IM SUSY
PREFILLED_SYRINGE | INTRAMUSCULAR | Status: AC
  Filled 2022-08-13: qty 0.5

## 2022-08-13 MED ORDER — TETANUS-DIPHTH-ACELL PERTUSSIS 5-2.5-18.5 LF-MCG/0.5 IM SUSY
0.5000 mL | PREFILLED_SYRINGE | Freq: Once | INTRAMUSCULAR | Status: AC
  Administered 2022-08-13: 0.5 mL via INTRAMUSCULAR

## 2022-08-13 NOTE — ED Triage Notes (Signed)
Pt states through professional translator, that his right pointer finger  has a finger laceration. He cut it on a a drill while working at his home about an hour ago. He doesn't know when his last Tdap was given.

## 2022-08-13 NOTE — ED Provider Notes (Signed)
[facility]    CSN: [id number] Arrival date & time: 08/13/22  1120      History   Chief Complaint Chief Complaint  Patient presents with   Finger Injury   Laceration    HPI Allen Gross is a 56 y.o. male who presents for evaluation of a laceration to his right index finger. Spanish interpreter used. The laceration was caused by drill. The lacerating object was intact. No prior treatment. Patient is not with his tetanus.  Patient is not on any blood thinning medications. No numbness, weakness, swelling or excessive bleeding.  Denies any other injuries or concerns at this time.    Laceration   Past Medical History:  Diagnosis Date   Atypical chest pain 07/18/2020   COVID-19 06/26/2020   Dyspnea on exertion 07/18/2020   History of COVID-19 07/18/2020   Nonspecific abnormal electrocardiogram (ECG) (EKG) 07/18/2020   Right bundle branch block 07/18/2020    Patient Active Problem List   Diagnosis Date Noted   Nonspecific abnormal electrocardiogram (ECG) (EKG) 07/18/2020   Right bundle branch block 07/18/2020   Dyspnea on exertion 07/18/2020   History of COVID-19 07/18/2020   Atypical chest pain 07/18/2020   COVID-19 06/26/2020    Past Surgical History:  Procedure Laterality Date   APPENDECTOMY     toxoplasmosis         Home Medications    Prior to Admission medications   Medication Sig Start Date End Date Taking? Authorizing Provider  atorvastatin (LIPITOR) 10 MG tablet Take 1 tablet (10 mg total) by mouth daily. 12/02/20 03/02/21  Georgeanna Lea, MD  fluticasone (FLONASE) 50 MCG/ACT nasal spray Place 1 spray into both nostrils daily.    [provider]  montelukast (SINGULAIR) 10 MG tablet Take 10 mg by mouth at bedtime.    [provider]    Family History Family History  Problem Relation Age of Onset   Hypertension Mother    Diabetes Mother    Heart attack Mother    Diabetes Father    Hypertension Father    Stroke  Maternal Grandfather     Social History Social History   Tobacco Use   Smoking status: Never   Smokeless tobacco: Never  Vaping Use   Vaping Use: Never used  Substance Use Topics   Alcohol use: Never   Drug use: Never     Allergies   Ciprofloxacin   Review of Systems Review of Systems  Skin:        Right index finger laceration      Physical Exam Triage Vital Signs ED Triage Vitals  Enc Vitals Group     BP 08/13/22 1236 (!) 152/94     Pulse Rate 08/13/22 1236 67     Resp 08/13/22 1236 18     Temp 08/13/22 1236 98.5 F (36.9 C)     Temp Source 08/13/22 1236 Oral     SpO2 08/13/22 1236 95 %     Weight --      Height --      Head Circumference --      Peak Flow --      Pain Score 08/13/22 1234 0     Pain Loc --      Pain Edu? --      Excl. in GC? --    No data found.  Updated Vital Signs BP (!) 152/94 (BP Location: Left Arm)   Pulse 67   Temp 98.5 F (36.9 C) (Oral)  Resp 18   SpO2 95%   Visual Acuity Right Eye Distance:   Left Eye Distance:   Bilateral Distance:    Right Eye Near:   Left Eye Near:    Bilateral Near:     Physical Exam Vitals and nursing note reviewed.  Constitutional:      Appearance: Normal appearance.  HENT:     Head: Normocephalic and atraumatic.  Eyes:     Pupils: Pupils are equal, round, and reactive to light.  Cardiovascular:     Rate and Rhythm: Normal rate.  Pulmonary:     Effort: Pulmonary effort is normal.  Musculoskeletal:       Hands:  Skin:    General: Skin is warm and dry.  Neurological:     General: No focal deficit present.     Mental Status: He is alert and oriented to person, place, and time.  Psychiatric:        Mood and Affect: Mood normal.        Behavior: Behavior normal.      UC Treatments / Results  Labs (all labs ordered are listed, but only abnormal results are displayed) Labs Reviewed - No data to display  EKG   Radiology No results found.  Procedures Laceration  Repair  Date/Time: 08/13/2022 12:48 PM  Performed by: Radford Pax, NP Authorized by: Radford Pax, NP   Consent:    Consent obtained:  Verbal   Consent given by:  Patient   Risks, benefits, and alternatives were discussed: yes     Risks discussed:  Infection, need for additional repair, nerve damage, poor wound healing, poor cosmetic result, tendon damage, retained foreign body, pain and vascular damage   Alternatives discussed:  Referral, no treatment and delayed treatment Universal protocol:    Patient identity confirmed:  Verbally with patient and arm band Anesthesia:    Anesthesia method:  None Laceration details:    Location:  Finger   Finger location:  R index finger   Length (cm):  1.3 Pre-procedure details:    Preparation:  Patient was prepped and draped in usual sterile fashion Exploration:    Limited defect created (wound extended): yes     Wound exploration: wound explored through full range of motion     Wound extent: no foreign bodies/material noted, no muscle damage noted, no tendon damage noted and no vascular damage noted     Contaminated: no   Treatment:    Visualized foreign bodies/material removed: no   Skin repair:    Repair method:  Steri-Strips and tissue adhesive Approximation:    Approximation:  Close Repair type:    Repair type:  Simple Post-procedure details:    Dressing:  Non-adherent dressing and splint for protection   Procedure completion:  Tolerated well, no immediate complications  (including critical care time)  Medications Ordered in UC Medications - No data to display  Initial Impression / Assessment and Plan / UC Course  I have reviewed the triage vital signs and the nursing notes.  Pertinent labs & imaging results that were available during my care of the patient were reviewed by me and considered in my medical decision making (see chart for details).     Wound cleansed by nursing staff Wound closed with dermabond and steri  strips. Non adherent dressing with finger splint applied Wound care and s/s of infection reviewed and pt verbalized understanding  Tetanus updated in clinic Follow up with PCP as needed ER precautions reviewed and pt verbalized  understanding  Final Clinical Impressions(s) / UC Diagnoses   Final diagnoses:  None   Discharge Instructions   None    ED Prescriptions   None    PDMP not reviewed this encounter.   Radford Pax, NP 08/13/22 1308

## 2022-08-13 NOTE — Discharge Instructions (Signed)
Keep wound clean and dry.  Do not get wet for 24 hours.  After that you may gently cleanse the wound but do not soak in water The surgical glue and surgical tape used to keep the wound close will come off on their own over 5 days.  Do not scrub the wound will remove the pieces of tape. Use the finger splint for the next 24 hours to help keep the wound protected Your tetanus has been updated in clinic and is good for 10 years Please monitor for any signs or symptoms of infection.  This includes but is not limited to redness, swelling, warmth, drainage, fevers/chills or any other concerns that you may have Follow-up with your PCP 2 to 3 days for recheck I hope you feel better soon

## 2022-12-14 DIAGNOSIS — E782 Mixed hyperlipidemia: Secondary | ICD-10-CM | POA: Insufficient documentation

## 2022-12-14 DIAGNOSIS — H1013 Acute atopic conjunctivitis, bilateral: Secondary | ICD-10-CM

## 2022-12-14 HISTORY — DX: Acute atopic conjunctivitis, bilateral: H10.13

## 2022-12-14 HISTORY — DX: Mixed hyperlipidemia: E78.2

## 2023-04-13 ENCOUNTER — Other Ambulatory Visit: Payer: Self-pay

## 2023-04-15 NOTE — Progress Notes (Signed)
  Cardiology Office Note:  .   Date:  04/15/2023  ID:  Drexel Iha, DOB [birth year], MRN [id number] PCP: Shelle Iron, MD  [facility] Providers Cardiologist:  None { Click to update primary MD,subspecialty MD or APP then REFRESH:1}   History of Present Illness: .   Allen Gross is a 57 y.o. male with a past medical history of hyperlipidemia, RBBB  He established care with Dr. Bing Matter in 2021 for evaluation of chest pain.  Chest pain was felt to be atypical and it was felt that no need for further ischemic evaluation was needed an echocardiogram was arranged and completed on 08/13/2020 revealed a EF of 60 to 65%, trivial MR.  Reevaluate again in March 2022, was doing well from a cardiac perspective, continue to have some atypical shocking type sensation chest pain.  Blood pressure was well-controlled however his cholesterol remained elevated.  ROS: ***  Studies Reviewed: .        *** Risk Assessment/Calculations:   {Does this patient have ATRIAL FIBRILLATION?:[id number]} No BP recorded.  {Refresh Note OR Click here to enter BP  :1}***       Physical Exam:   VS:  There were no vitals taken for this visit.   Wt Readings from Last 3 Encounters:  11/28/20 147 lb (66.7 kg)  07/18/20 145 lb (65.8 kg)  11/08/18 150 lb (68 kg)    GEN: Well nourished, well developed in no acute distress NECK: No JVD; No carotid bruits CARDIAC: ***RRR, no murmurs, rubs, gallops RESPIRATORY:  Clear to auscultation without rales, wheezing or rhonchi  ABDOMEN: Soft, non-tender, non-distended EXTREMITIES:  No edema; No deformity   ASSESSMENT AND PLAN: .   ***    {Are you ordering a CV Procedure (e.g. stress test, cath, DCCV, TEE, etc)?   Press F2        :[id number]}  Dispo: ***  Signed, Flossie Dibble, NP

## 2023-04-18 ENCOUNTER — Encounter: Payer: Self-pay | Admitting: Cardiology

## 2023-04-18 ENCOUNTER — Ambulatory Visit: Payer: BLUE CROSS/BLUE SHIELD | Attending: Cardiology | Admitting: Cardiology

## 2023-04-18 VITALS — BP 120/80 | HR 79 | Ht 68.0 in | Wt 143.0 lb

## 2023-04-18 DIAGNOSIS — R0789 Other chest pain: Secondary | ICD-10-CM

## 2023-04-18 DIAGNOSIS — E782 Mixed hyperlipidemia: Secondary | ICD-10-CM

## 2023-04-18 DIAGNOSIS — I4510 Unspecified right bundle-branch block: Secondary | ICD-10-CM

## 2023-04-18 NOTE — Patient Instructions (Signed)
Medication Instructions:  Your physician recommends that you continue on your current medications as directed. Please refer to the Current Medication list given to you today.  *If you need a refill on your cardiac medications before your next appointment, please call your pharmacy*   Lab Work: NONE If you have labs (blood work) drawn today and your tests are completely normal, you will receive your results only by: MyChart Message (if you have MyChart) OR A paper copy in the mail If you have any lab test that is abnormal or we need to change your treatment, we will call you to review the results.   Testing/Procedures: NONE   Follow-Up: At [facility], you and your health needs are our priority.  As part of our continuing mission to provide you with exceptional heart care, we have created designated Provider Care Teams.  These Care Teams include your primary Cardiologist (physician) and Advanced Practice Providers (APPs -  Physician Assistants and Nurse Practitioners) who all work together to provide you with the care you need, when you need it.  We recommend signing up for the patient portal called "MyChart".  Sign up information is provided on this After Visit Summary.  MyChart is used to connect with patients for Virtual Visits (Telemedicine).  Patients are able to view lab/test results, encounter notes, upcoming appointments, etc.  Non-urgent messages can be sent to your provider as well.   To learn more about what you can do with MyChart, go to [email or web address].    Your next appointment:   1 year(s)  Provider:   Olga Millers, MD or Gypsy Balsam, MD    Other Instructions

## 2023-05-11 ENCOUNTER — Ambulatory Visit: Payer: Commercial Managed Care - HMO | Admitting: Cardiology

## 2023-08-03 ENCOUNTER — Other Ambulatory Visit: Payer: Self-pay | Admitting: Orthopedic Surgery

## 2023-08-03 DIAGNOSIS — M5432 Sciatica, left side: Secondary | ICD-10-CM

## 2023-08-03 DIAGNOSIS — M4726 Other spondylosis with radiculopathy, lumbar region: Secondary | ICD-10-CM

## 2023-08-03 DIAGNOSIS — M5116 Intervertebral disc disorders with radiculopathy, lumbar region: Secondary | ICD-10-CM

## 2023-08-03 DIAGNOSIS — M47816 Spondylosis without myelopathy or radiculopathy, lumbar region: Secondary | ICD-10-CM

## 2023-09-15 ENCOUNTER — Encounter: Payer: Self-pay | Admitting: Orthopedic Surgery

## 2023-09-23 ENCOUNTER — Other Ambulatory Visit: Payer: BLUE CROSS/BLUE SHIELD

## 2024-01-01 LAB — COLOGUARD: COLOGUARD: NEGATIVE
